# Patient Record
Sex: Female | Born: 1955 | Race: White | Hispanic: No | Marital: Married | Smoking: Never smoker
Health system: Southern US, Community
[De-identification: ages and names within clinical notes are randomized; demographics above are authoritative.]

## PROBLEM LIST (undated history)

## (undated) DIAGNOSIS — G43909 Migraine, unspecified, not intractable, without status migrainosus: Secondary | ICD-10-CM

## (undated) DIAGNOSIS — H8109 Meniere's disease, unspecified ear: Secondary | ICD-10-CM

## (undated) DIAGNOSIS — J45909 Unspecified asthma, uncomplicated: Secondary | ICD-10-CM

## (undated) DIAGNOSIS — K219 Gastro-esophageal reflux disease without esophagitis: Secondary | ICD-10-CM

## (undated) DIAGNOSIS — I10 Essential (primary) hypertension: Secondary | ICD-10-CM

## (undated) HISTORY — DX: Meniere's disease, unspecified ear: H81.09

## (undated) HISTORY — DX: Migraine, unspecified, not intractable, without status migrainosus: G43.909

## (undated) HISTORY — PX: COLONOSCOPY: SHX174

## (undated) HISTORY — PX: ENDOLYMPHATIC SAC OPERATION: SUR433

## (undated) HISTORY — DX: Unspecified asthma, uncomplicated: J45.909

## (undated) HISTORY — PX: BREAST LUMPECTOMY: SHX2

## (undated) HISTORY — DX: Gastro-esophageal reflux disease without esophagitis: K21.9

## (undated) HISTORY — PX: TEMPOROMANDIBULAR JOINT SURGERY: SHX35

## (undated) HISTORY — DX: Essential (primary) hypertension: I10

---

## 2000-05-05 ENCOUNTER — Ambulatory Visit (HOSPITAL_COMMUNITY): Admission: RE | Admit: 2000-05-05 | Discharge: 2000-05-05 | Payer: Self-pay | Admitting: Gynecology

## 2001-07-13 ENCOUNTER — Emergency Department (HOSPITAL_COMMUNITY): Admission: EM | Admit: 2001-07-13 | Discharge: 2001-07-13 | Payer: Self-pay | Admitting: Emergency Medicine

## 2001-07-14 ENCOUNTER — Encounter: Payer: Self-pay | Admitting: Emergency Medicine

## 2001-07-15 ENCOUNTER — Emergency Department (HOSPITAL_COMMUNITY): Admission: EM | Admit: 2001-07-15 | Discharge: 2001-07-15 | Payer: Self-pay | Admitting: Emergency Medicine

## 2002-06-25 ENCOUNTER — Other Ambulatory Visit: Admission: RE | Admit: 2002-06-25 | Discharge: 2002-06-25 | Payer: Self-pay | Admitting: Obstetrics and Gynecology

## 2003-09-09 ENCOUNTER — Other Ambulatory Visit: Admission: RE | Admit: 2003-09-09 | Discharge: 2003-09-09 | Payer: Self-pay | Admitting: Gynecology

## 2003-09-09 ENCOUNTER — Encounter: Admission: RE | Admit: 2003-09-09 | Discharge: 2003-09-09 | Payer: Self-pay | Admitting: Otolaryngology

## 2003-09-09 ENCOUNTER — Encounter: Payer: Self-pay | Admitting: Otolaryngology

## 2004-04-27 ENCOUNTER — Encounter: Admission: RE | Admit: 2004-04-27 | Discharge: 2004-04-27 | Payer: Self-pay | Admitting: Obstetrics & Gynecology

## 2004-09-16 ENCOUNTER — Other Ambulatory Visit: Admission: RE | Admit: 2004-09-16 | Discharge: 2004-09-16 | Payer: Self-pay | Admitting: Gynecology

## 2004-10-08 ENCOUNTER — Encounter: Admission: RE | Admit: 2004-10-08 | Discharge: 2004-10-08 | Payer: Self-pay | Admitting: Family Medicine

## 2004-10-20 ENCOUNTER — Ambulatory Visit (HOSPITAL_COMMUNITY): Admission: RE | Admit: 2004-10-20 | Discharge: 2004-10-20 | Payer: Self-pay | Admitting: Family Medicine

## 2005-09-19 ENCOUNTER — Other Ambulatory Visit: Admission: RE | Admit: 2005-09-19 | Discharge: 2005-09-19 | Payer: Self-pay | Admitting: Gynecology

## 2005-11-19 ENCOUNTER — Emergency Department: Payer: Self-pay | Admitting: Unknown Physician Specialty

## 2005-11-20 ENCOUNTER — Emergency Department: Payer: Self-pay | Admitting: Emergency Medicine

## 2007-04-27 ENCOUNTER — Ambulatory Visit: Payer: Self-pay | Admitting: Internal Medicine

## 2007-12-30 ENCOUNTER — Emergency Department: Payer: Self-pay | Admitting: Emergency Medicine

## 2008-04-09 ENCOUNTER — Telehealth: Payer: Self-pay | Admitting: Internal Medicine

## 2008-04-30 ENCOUNTER — Telehealth: Payer: Self-pay | Admitting: Internal Medicine

## 2008-05-02 ENCOUNTER — Ambulatory Visit: Payer: Self-pay | Admitting: Internal Medicine

## 2008-05-05 LAB — CONVERTED CEMR LAB: Hepatitis B Surface Ag: NEGATIVE

## 2008-05-27 DIAGNOSIS — R109 Unspecified abdominal pain: Secondary | ICD-10-CM | POA: Insufficient documentation

## 2008-05-27 DIAGNOSIS — Z862 Personal history of diseases of the blood and blood-forming organs and certain disorders involving the immune mechanism: Secondary | ICD-10-CM

## 2008-05-27 DIAGNOSIS — H8109 Meniere's disease, unspecified ear: Secondary | ICD-10-CM | POA: Insufficient documentation

## 2008-05-27 DIAGNOSIS — E785 Hyperlipidemia, unspecified: Secondary | ICD-10-CM | POA: Insufficient documentation

## 2008-05-27 DIAGNOSIS — K219 Gastro-esophageal reflux disease without esophagitis: Secondary | ICD-10-CM

## 2008-05-27 DIAGNOSIS — E559 Vitamin D deficiency, unspecified: Secondary | ICD-10-CM | POA: Insufficient documentation

## 2008-05-27 DIAGNOSIS — J45909 Unspecified asthma, uncomplicated: Secondary | ICD-10-CM | POA: Insufficient documentation

## 2008-05-27 DIAGNOSIS — Z8619 Personal history of other infectious and parasitic diseases: Secondary | ICD-10-CM

## 2008-05-27 DIAGNOSIS — Z8639 Personal history of other endocrine, nutritional and metabolic disease: Secondary | ICD-10-CM | POA: Insufficient documentation

## 2008-05-28 ENCOUNTER — Ambulatory Visit: Payer: Self-pay | Admitting: Internal Medicine

## 2008-06-04 ENCOUNTER — Ambulatory Visit (HOSPITAL_COMMUNITY): Admission: RE | Admit: 2008-06-04 | Discharge: 2008-06-04 | Payer: Self-pay | Admitting: Internal Medicine

## 2008-06-04 ENCOUNTER — Encounter: Payer: Self-pay | Admitting: Internal Medicine

## 2008-06-05 ENCOUNTER — Ambulatory Visit: Payer: Self-pay | Admitting: Internal Medicine

## 2008-06-05 ENCOUNTER — Telehealth: Payer: Self-pay | Admitting: Internal Medicine

## 2010-03-19 ENCOUNTER — Emergency Department: Payer: Self-pay | Admitting: Internal Medicine

## 2010-03-19 ENCOUNTER — Emergency Department: Payer: Self-pay | Admitting: Unknown Physician Specialty

## 2011-04-12 NOTE — Assessment & Plan Note (Signed)
[facility]                         GASTROENTEROLOGY OFFICE NOTE   NAME:Emma Snyder, Emma Snyder                        MRN:          [id number]  DATE:04/27/2007                            DOB:          04/05/1956    Emma Snyder is a very nice 55 year old white female who is here to discuss  change in her bowel habits, mostly diarrhea, and also colorectal  screening.  For several years now she has had postprandial urgency and  sometimes diarrhea.  This occurs within 30 minutes to 2 hours  postprandially.  It never happens at night.  There has been 1 accident  when she could not make it to the bathroom.  She denies constipation.  Her usual bowel habits are 1 bowel movement once or twice a day.  Her  weight has increased about 40 pounds in the last 9 to 10 years.  She has  a sedentary job in a bank.  She has good eating habits, trying to  incorporate a little fiber in her diet.  She also has some symptoms of  gastroesophageal reflux for which she took some acid-suppressing agents.   MEDICATIONS:  1. Advair 250 mcg, 1 puff daily.  2. Triamterene 1 p.o. daily.  3. Nexium 40 mg daily.   PAST MEDICAL HISTORY:  Asthmatic bronchitis.   PAST SURGICAL HISTORY:  1. TMJ surgery 25 years ago.  2. Endometriosis surgery 9 years ago.  3. Sinus surgery 4 years ago.   FAMILY HISTORY:  Positive for diverticulosis in her father, breast  cancer in aunt, diabetes in father.   SOCIAL HISTORY:  Married, 1 step-son.  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  She does not smoke.  She drinks about 10 glasses of  wine a week.   REVIEW OF SYSTEMS:  Positive for night sweats, fatigue, and diarrhea.   PHYSICAL EXAMINATION:  VITAL SIGNS:  Blood pressure 110/60, pulse 84,  and weight 169 pounds.  GENERAL:  She was alert and oriented and in no distress.  NECK:  Somewhat thick.  I could not be sure if whether she had a goiter  or just a thick neck.  LUNGS:  Clear to  auscultation.  CARDIAC:  Normal S1, normal S2.  ABDOMEN:  Soft, with minimal tenderness in the left lower quadrant.  Normal left upper quadrant and right upper and lower quadrants.  Liver  edge was at costal margin.  There were no scars.  There was no  distension.  Bowel sounds were normoactive.  There was no CVA  tenderness.  RECTAL:  Exam reveals normal perianal area, normal rectal tone with  small amount of Hemoccult-negative stool.   IMPRESSION:  55 year old white female with symptoms suggestive of  irritable bowel syndrome, namely postprandial urgency and diarrhea,  without any obvious occult gastrointestinal blood loss or failure to  thrive.  There is no family history of inflammatory bowel disease, and  she has actually continued to gain weight.  Besides irritable bowel  syndrome, one would consider the possibility of lactose intolerance or  possible sprue.  Also rule out symptomatic  diverticulosis since she has  a family history of it.  Also her thyroid may be enlarged, so we are  going to check a thyroid-stimulating hormone to make sure she does not  have hyperactive thyroid.   PLAN:  1. Tissue transglutaminase levels and TSH.  This might have been      checked by Dr. Smith Mince, but I do not have the results.  2. I discussed treatment of irritable bowel syndrome over with the      patient, and we will start her on Levbid 0.375 mg p.o. daily or      b.i.d.  3. High fiber diet.  4. Colonoscopy scheduled for neoplastic screening.  5. Information booklet on IBS given to the patient.  6. During the colonoscopy we will plan to get random biopsies to rule      out microscopic colitis.     Hedwig Morton. Juanda Chance, MD  Electronically Signed    DMB/MedQ  DD: 04/27/2007  DT: 04/27/2007  Job #: [id number]   cc:   Talmadge Coventry, M.D.  Luvenia Redden, M.D.

## 2011-04-15 NOTE — Op Note (Signed)
[facility]  Patient:    Emma Snyder, Emma Snyder                        MRN: [id number] Proc. Date: 05/05/00 Adm. Date:  [id number] Disc. Date: [id number] Attending:  Susa Raring                           Operative Report  PREOPERATIVE DIAGNOSES:       1. Chronic pelvic pain.                               2. Dysmenorrhea.  POSTOPERATIVE DIAGNOSES:      1. Pelvic adhesions.                               2. No endometriosis found.  OPERATION:                    Laparoscopy and lysis of adhesions.  SURGEON:                      Luvenia Redden, M.D.  PROCEDURE:                    With good anesthesia the patient was prepped and draped in a sterile manner.  Hulka tenaculum placed on the uterus.  The bladder was catheterization.  Transverse incision was made just below the umbilicus.  Veress needle inserted into the peritoneum and pneumoperitoneum formed.  The needle was removed.  The operative Trocar was introduced.  Trocar was removed from the cannula and the operative laparoscope inserted.  Pelvic organs reviewed.  There were adhesions from the omentum to the fundus of the uterus.  Operative 5 mm Trocar was introduced through an avascular site in the midline.  The adhesions were dissected free of the uterus and the cornua by sharp dissection and freed up.  There were adhesions also down in the right lower quadrant.  These were lysed sharply.  Pelvic peritoneum was inspected. There was no evidence of any endometriosis in the cul-de-sac, on the bladder peritoneum, uterus, tubes, or ovaries.  The area was irrigated with lactated Ringers and this was aspirated.  Upper abdominal organs appeared normal on the surface as did the liver and the gallbladder.  Lactated Ringers 200 cc was instilled into the pelvis to float the pelvic organs.  Pneumoperitoneum was released and the instruments were removed.  Skin incisions were closed with subcuticular 2-0 plain  catgut.  Band-aid was applied.  The patient was removed to recovery in good condition. DD:  07/11/00 TD:  07/11/00 Job: [id number]

## 2016-01-05 ENCOUNTER — Other Ambulatory Visit: Payer: Self-pay | Admitting: Obstetrics and Gynecology

## 2016-01-05 DIAGNOSIS — E2839 Other primary ovarian failure: Secondary | ICD-10-CM

## 2016-01-05 DIAGNOSIS — R5381 Other malaise: Secondary | ICD-10-CM

## 2016-11-11 ENCOUNTER — Other Ambulatory Visit: Payer: Self-pay | Admitting: Obstetrics and Gynecology

## 2016-11-11 DIAGNOSIS — R5381 Other malaise: Secondary | ICD-10-CM

## 2016-12-23 ENCOUNTER — Other Ambulatory Visit: Payer: Self-pay | Admitting: Neurosurgery

## 2016-12-23 DIAGNOSIS — M5412 Radiculopathy, cervical region: Secondary | ICD-10-CM

## 2016-12-30 ENCOUNTER — Ambulatory Visit
Admission: RE | Admit: 2016-12-30 | Discharge: 2016-12-30 | Disposition: A | Discharge status: Home / Self Care | Payer: BLUE CROSS/BLUE SHIELD | Source: Ambulatory Visit | Attending: Neurosurgery | Admitting: Neurosurgery

## 2016-12-30 DIAGNOSIS — M5412 Radiculopathy, cervical region: Secondary | ICD-10-CM

## 2016-12-30 MED ORDER — GADOBENATE DIMEGLUMINE 529 MG/ML IV SOLN
20.0000 mL | Freq: Once | INTRAVENOUS | Status: DC | PRN
Start: 2016-12-30 — End: 2016-12-31

## 2017-01-03 ENCOUNTER — Other Ambulatory Visit: Payer: Self-pay | Admitting: Neurosurgery

## 2017-01-03 DIAGNOSIS — M5412 Radiculopathy, cervical region: Secondary | ICD-10-CM

## 2017-01-10 ENCOUNTER — Other Ambulatory Visit: Payer: Self-pay | Admitting: Obstetrics and Gynecology

## 2017-01-10 DIAGNOSIS — M810 Age-related osteoporosis without current pathological fracture: Secondary | ICD-10-CM

## 2017-01-12 ENCOUNTER — Other Ambulatory Visit: Payer: Self-pay | Admitting: Obstetrics and Gynecology

## 2017-01-12 DIAGNOSIS — R928 Other abnormal and inconclusive findings on diagnostic imaging of breast: Secondary | ICD-10-CM

## 2017-01-16 ENCOUNTER — Ambulatory Visit
Admission: RE | Admit: 2017-01-16 | Discharge: 2017-01-16 | Disposition: A | Discharge status: Home / Self Care | Payer: BLUE CROSS/BLUE SHIELD | Source: Ambulatory Visit | Attending: Obstetrics and Gynecology | Admitting: Obstetrics and Gynecology

## 2017-01-16 DIAGNOSIS — R928 Other abnormal and inconclusive findings on diagnostic imaging of breast: Secondary | ICD-10-CM

## 2017-02-20 ENCOUNTER — Other Ambulatory Visit: Payer: Self-pay | Admitting: Obstetrics and Gynecology

## 2017-02-20 DIAGNOSIS — E2839 Other primary ovarian failure: Secondary | ICD-10-CM

## 2017-02-21 ENCOUNTER — Ambulatory Visit
Admission: RE | Admit: 2017-02-21 | Discharge: 2017-02-21 | Disposition: A | Discharge status: Home / Self Care | Payer: BLUE CROSS/BLUE SHIELD | Source: Ambulatory Visit | Attending: Obstetrics and Gynecology | Admitting: Obstetrics and Gynecology

## 2017-02-21 ENCOUNTER — Other Ambulatory Visit: Payer: BLUE CROSS/BLUE SHIELD

## 2017-02-21 DIAGNOSIS — E2839 Other primary ovarian failure: Secondary | ICD-10-CM

## 2018-04-09 ENCOUNTER — Encounter: Payer: Self-pay | Admitting: Internal Medicine

## 2019-05-22 ENCOUNTER — Other Ambulatory Visit: Payer: Self-pay | Admitting: Obstetrics and Gynecology

## 2019-05-22 DIAGNOSIS — R928 Other abnormal and inconclusive findings on diagnostic imaging of breast: Secondary | ICD-10-CM

## 2019-05-30 ENCOUNTER — Other Ambulatory Visit: Payer: BLUE CROSS/BLUE SHIELD

## 2019-06-14 ENCOUNTER — Ambulatory Visit
Admission: RE | Admit: 2019-06-14 | Discharge: 2019-06-14 | Disposition: A | Discharge status: Home / Self Care | Payer: BC Managed Care – PPO | Source: Ambulatory Visit | Attending: Obstetrics and Gynecology | Admitting: Obstetrics and Gynecology

## 2019-06-14 ENCOUNTER — Ambulatory Visit
Admission: RE | Admit: 2019-06-14 | Discharge: 2019-06-14 | Disposition: A | Discharge status: Home / Self Care | Payer: BLUE CROSS/BLUE SHIELD | Source: Ambulatory Visit | Attending: Obstetrics and Gynecology | Admitting: Obstetrics and Gynecology

## 2019-06-14 ENCOUNTER — Other Ambulatory Visit: Payer: Self-pay | Admitting: Obstetrics and Gynecology

## 2019-06-14 DIAGNOSIS — R928 Other abnormal and inconclusive findings on diagnostic imaging of breast: Secondary | ICD-10-CM

## 2019-06-14 DIAGNOSIS — N6489 Other specified disorders of breast: Secondary | ICD-10-CM

## 2019-06-21 ENCOUNTER — Ambulatory Visit
Admission: RE | Admit: 2019-06-21 | Discharge: 2019-06-21 | Disposition: A | Discharge status: Home / Self Care | Payer: BC Managed Care – PPO | Source: Ambulatory Visit | Attending: Obstetrics and Gynecology | Admitting: Obstetrics and Gynecology

## 2019-06-21 ENCOUNTER — Other Ambulatory Visit: Payer: Self-pay

## 2019-06-21 DIAGNOSIS — N6489 Other specified disorders of breast: Secondary | ICD-10-CM

## 2019-08-24 IMAGING — MG DIGITAL DIAGNOSTIC UNILATERAL RIGHT MAMMOGRAM WITH TOMO AND CAD
8 series · 8 of 24 positions shown · non-contrast
Comparison: Previous exam(s).

CLINICAL DATA: Screening recall for a possible distortion in the
right breast.

EXAM:
DIGITAL DIAGNOSTIC RIGHT MAMMOGRAM WITH CAD AND TOMO
ULTRASOUND RIGHT BREAST

[R CC synth-2D (1 of 2)]
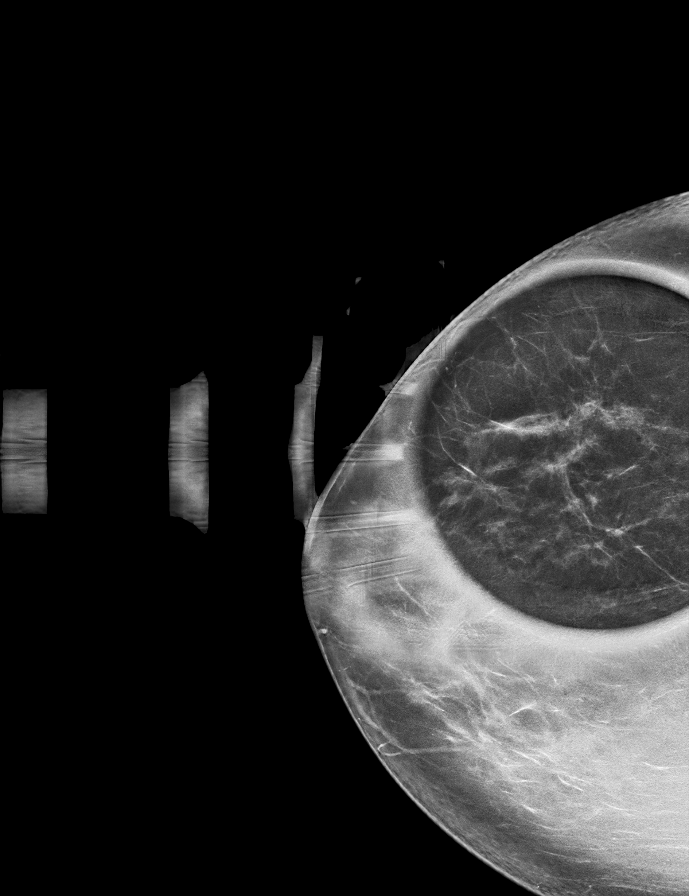

[R MLO synth-2D (1 of 2)]
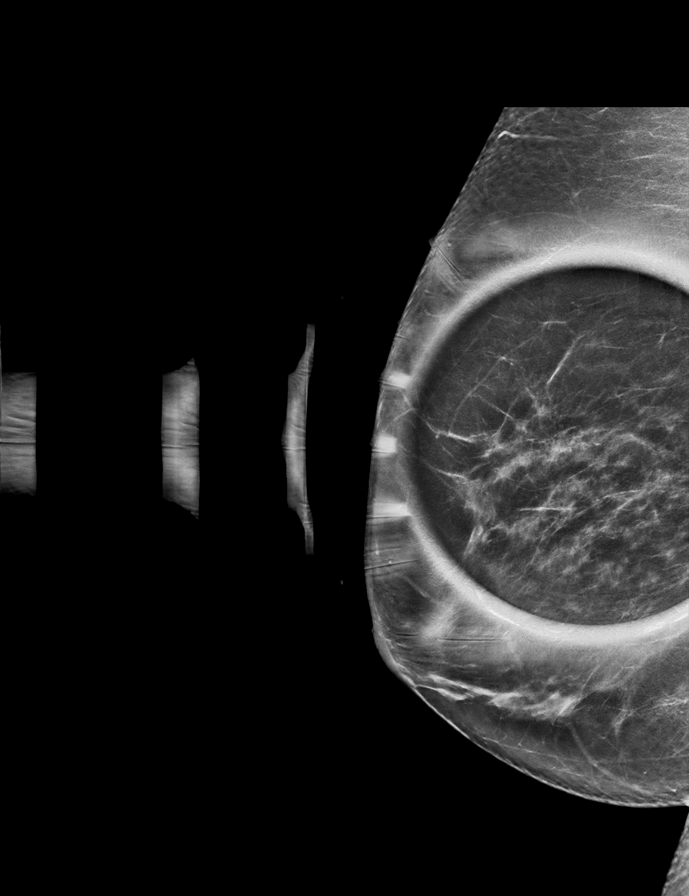

[R CC synth-2D (2 of 2)]
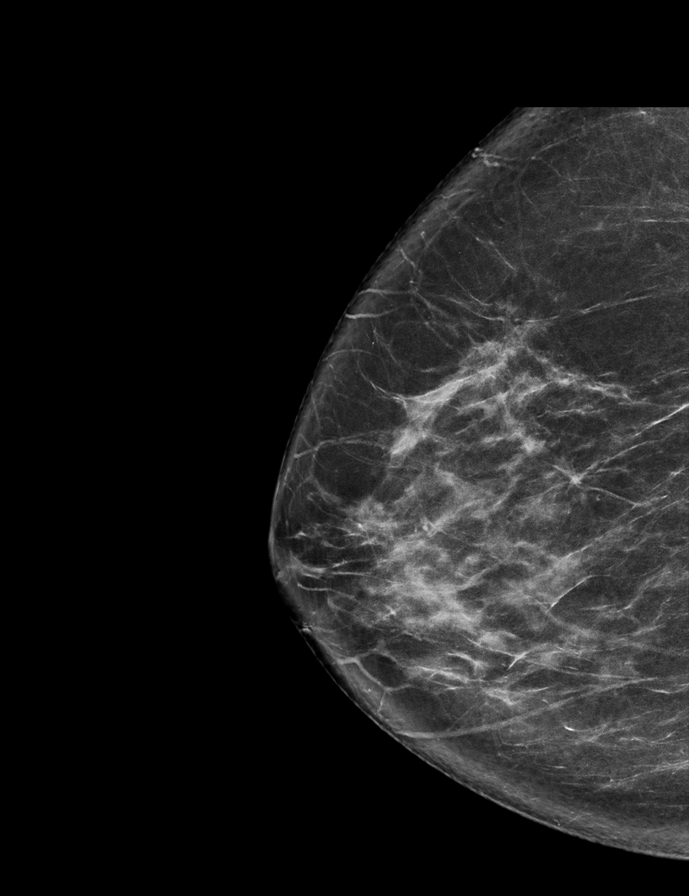

[R MLO synth-2D (2 of 2)]
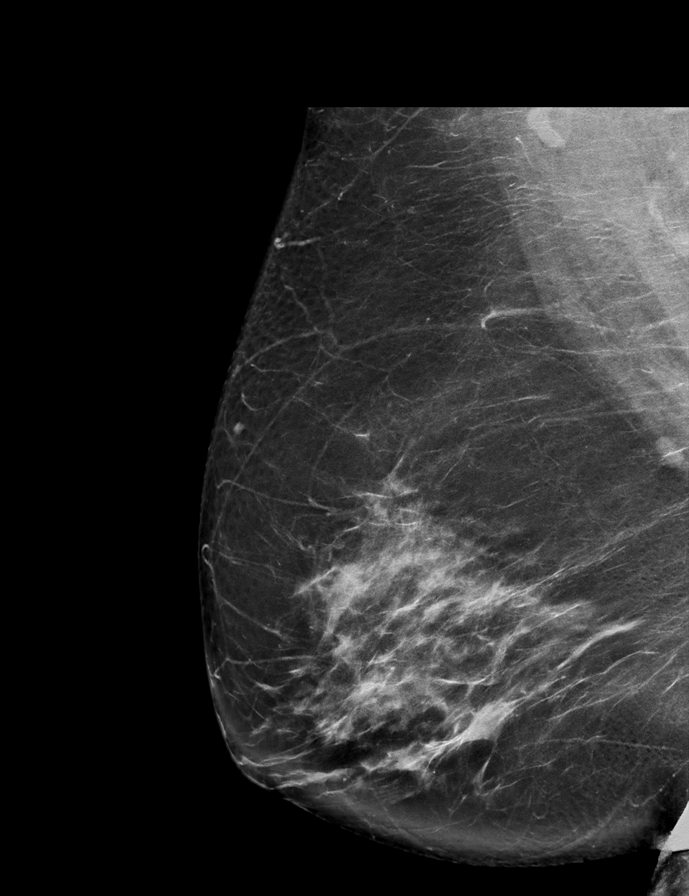

[R MLO tomo (1 of 2) · tomo slice 35/70.0]
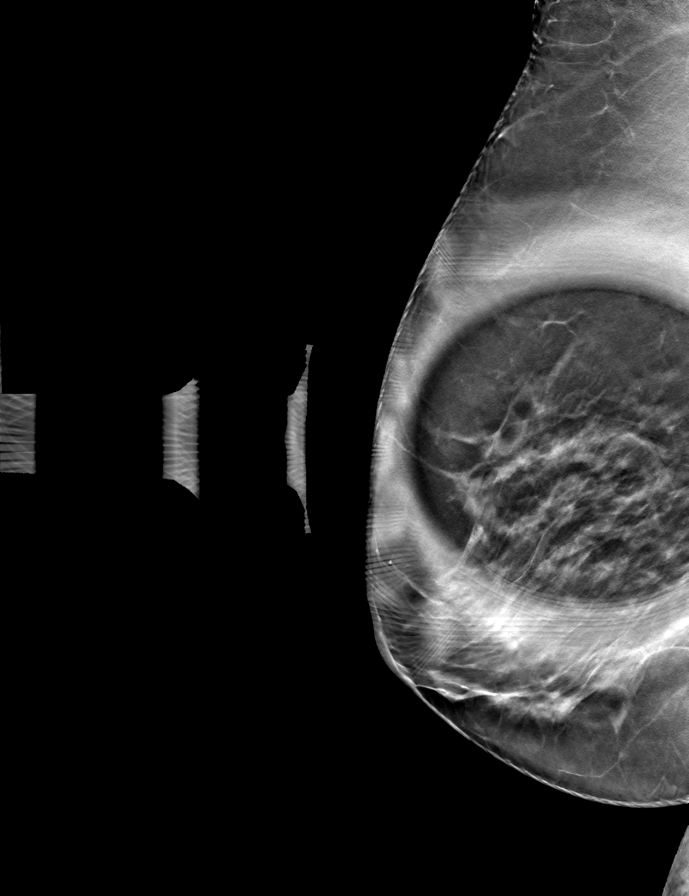

[R MLO tomo (2 of 2) · tomo slice 43/86.0]
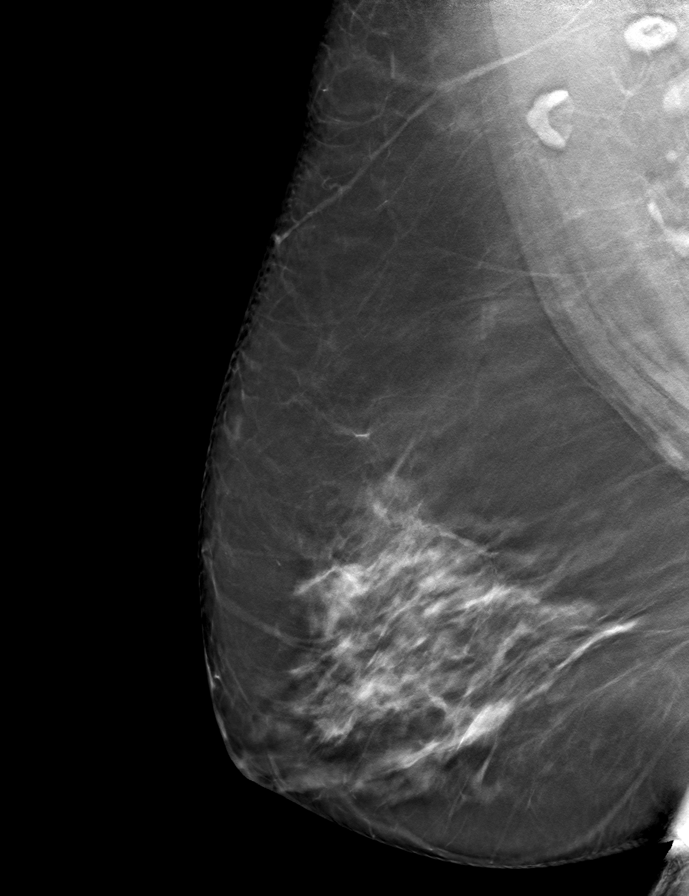

[R CC tomo (1 of 2) · tomo slice 33/65.0]
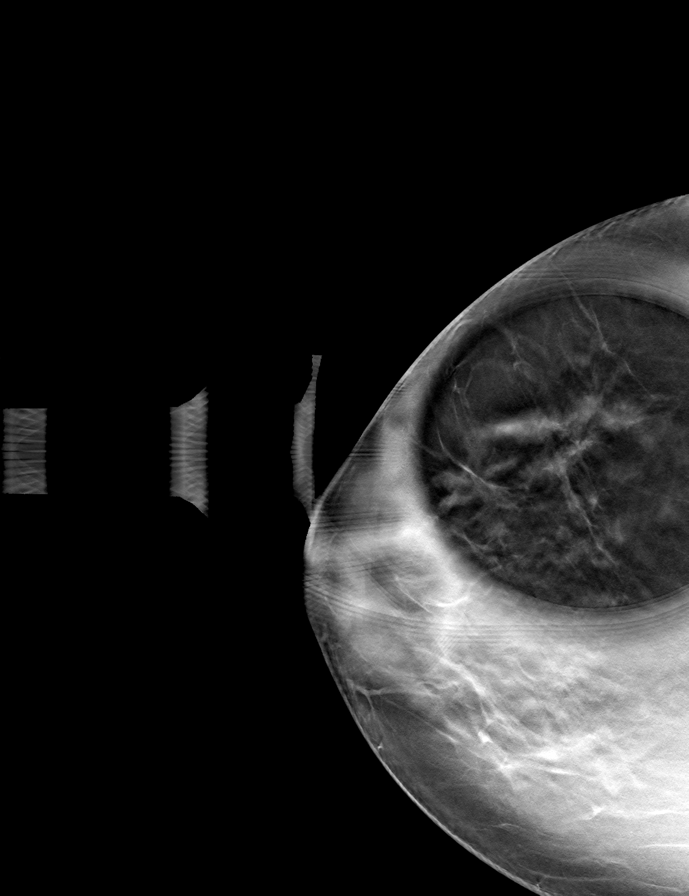

[R CC tomo (2 of 2) · tomo slice 37/73.0]
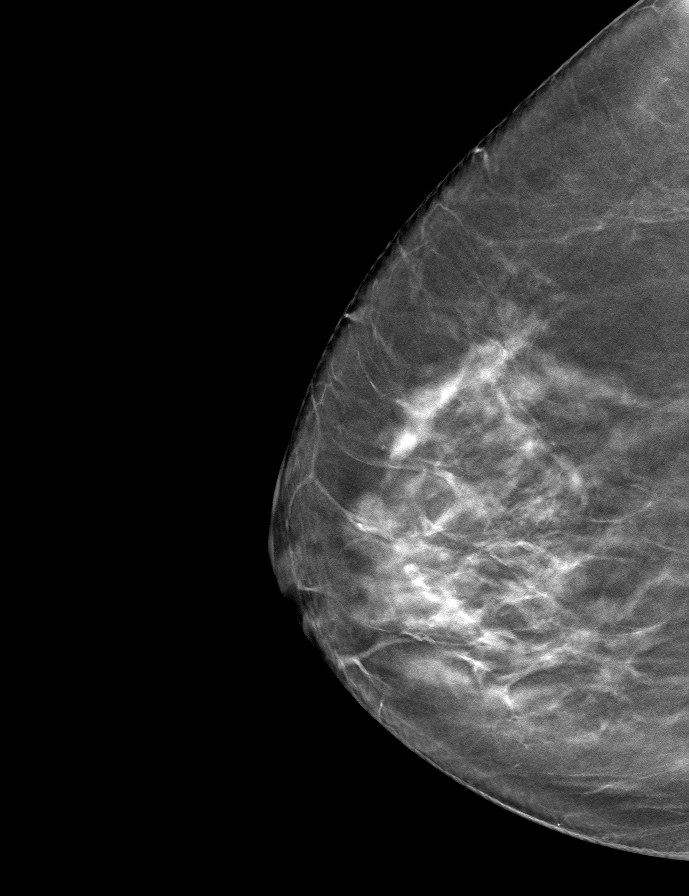

[8 of 24 positions shown; findings below may reference images not displayed]

ACR Breast Density Category c: The breast tissue is heterogeneously
dense, which may obscure small masses.
FINDINGS: Spot compression tomosynthesis images through the upper outer
quadrant of the right breast demonstrates a possible subtle area of
distortion in the posterior depth.

Mammographic images were processed with CAD.

Ultrasound of the upper-outer quadrant of the right breast
demonstrates no suspicious masses or areas of shadowing.
IMPRESSION: 1. There is a possible subtle area of distortion in the upper-outer
quadrant of the right breast without a sonographic correlate.

RECOMMENDATION:
Stereotactic biopsy is recommended for the possible subtle right
breast distortion. The patient is aware that due to the subtle
appearance, the biopsy may be canceled if the distortion cannot be
found and a six-month follow-up right breast mammogram would be
recommended.

The stereotactic biopsy has been scheduled for 06/21/2019 at [DATE]
a.m.

I have discussed the findings and recommendations with the patient.
Results were also provided in writing at the conclusion of the
visit. If applicable, a reminder letter will be sent to the patient
regarding the next appointment.

BI-RADS CATEGORY  4: Suspicious.

## 2022-04-20 ENCOUNTER — Encounter: Payer: Self-pay | Admitting: Internal Medicine

## 2022-05-24 ENCOUNTER — Ambulatory Visit (AMBULATORY_SURGERY_CENTER): Payer: Self-pay | Admitting: *Deleted

## 2022-05-24 VITALS — Ht 62.0 in | Wt 203.0 lb

## 2022-05-24 DIAGNOSIS — Z1211 Encounter for screening for malignant neoplasm of colon: Secondary | ICD-10-CM

## 2022-05-24 MED ORDER — ONDANSETRON HCL 4 MG PO TABS: 4.0000 mg | ORAL_TABLET | Freq: Three times a day (TID) | ORAL | 0 refills | Status: AC | PRN

## 2022-05-24 MED ORDER — NA SULFATE-K SULFATE-MG SULF 17.5-3.13-1.6 GM/177ML PO SOLN: 1.0000 | Freq: Once | ORAL | 0 refills | Status: AC

## 2022-05-24 NOTE — Progress Notes (Signed)
  No trouble with anesthesia, denies being told they were difficult to intubate, or hx/fam hx of malignant hyperthermia per pt   No egg or soy allergy  No home oxygen use   No medications for weight loss taken  emmi information given  Pt denies constipation issues  Pt informed that we do not do prior authorizations for prep  Zofran 4 mg po #2, 0 refills sent to pharmacy and pt instructed how to take prior to drinking prep

## 2022-06-16 ENCOUNTER — Encounter: Payer: Self-pay | Admitting: Internal Medicine

## 2022-06-21 ENCOUNTER — Ambulatory Visit (AMBULATORY_SURGERY_CENTER): Payer: BC Managed Care – PPO | Admitting: Internal Medicine

## 2022-06-21 ENCOUNTER — Encounter: Payer: Self-pay | Admitting: Internal Medicine

## 2022-06-21 VITALS — BP 132/68 | HR 92 | Temp 97.8°F | Resp 29 | Ht 62.0 in | Wt 203.0 lb

## 2022-06-21 DIAGNOSIS — D122 Benign neoplasm of ascending colon: Secondary | ICD-10-CM | POA: Diagnosis not present

## 2022-06-21 DIAGNOSIS — D120 Benign neoplasm of cecum: Secondary | ICD-10-CM

## 2022-06-21 DIAGNOSIS — Z1211 Encounter for screening for malignant neoplasm of colon: Secondary | ICD-10-CM

## 2022-06-21 DIAGNOSIS — D123 Benign neoplasm of transverse colon: Secondary | ICD-10-CM

## 2022-06-21 MED ORDER — SODIUM CHLORIDE 0.9 % IV SOLN: 500.0000 mL | Freq: Once | INTRAVENOUS | Status: DC

## 2022-06-21 NOTE — Progress Notes (Signed)
Pt's states no medical or surgical changes since previsit or office visit. 

## 2022-06-21 NOTE — Op Note (Signed)
[facility] Patient Name: Donye Dauenhauer Procedure Date: 06/21/2022 9:07 AM MRN: [id number] Endoscopist: Sonny Masters "Christia Reading ,  Age: 66 Referring MD:  Date of Birth: 09-23-1956 Gender: Female Account #: [id number] Procedure:                Colonoscopy Indications:              Screening for colorectal malignant neoplasm Medicines:                Monitored Anesthesia Care Procedure:                Pre-Anesthesia Assessment:                           - Prior to the procedure, a History and Physical                            was performed, and patient medications and                            allergies were reviewed. The patient's tolerance of                            previous anesthesia was also reviewed. The risks                            and benefits of the procedure and the sedation                            options and risks were discussed with the patient.                            All questions were answered, and informed consent                            was obtained. Prior Anticoagulants: The patient has                            taken no previous anticoagulant or antiplatelet                            agents. ASA Grade Assessment: II - A patient with                            mild systemic disease. After reviewing the risks                            and benefits, the patient was deemed in                            satisfactory condition to undergo the procedure.                           After obtaining informed consent, the colonoscope  was passed under direct vision. Throughout the                            procedure, the patient's blood pressure, pulse, and                            oxygen saturations were monitored continuously. The                            Olympus CF-HQ190L [id number]) Colonoscope was                            introduced through the anus and advanced to the the                            terminal ileum.  The colonoscopy was performed                            without difficulty. The patient tolerated the                            procedure well. The quality of the bowel                            preparation was good. The terminal ileum, ileocecal                            valve, appendiceal orifice, and rectum were                            photographed. Scope In: 9:09:02 AM Scope Out: 9:38:35 AM Scope Withdrawal Time: 0 hours 20 minutes 21 seconds  Total Procedure Duration: 0 hours 29 minutes 33 seconds  Findings:                 The terminal ileum appeared normal.                           A few diverticula were found in the ascending colon.                           A 2 mm polyp was found in the cecum. The polyp was                            sessile. The polyp was removed with a cold biopsy                            forceps. Resection and retrieval were complete.                           Three sessile polyps were found in the transverse                            colon and ascending colon. The polyps were 3 to  5                            mm in size. These polyps were removed with a cold                            snare. Resection and retrieval were complete.                           Two sessile polyps were found in the sigmoid colon                            and descending colon. The polyps were 1 to 2 mm in                            size. These polyps were removed with a cold biopsy                            forceps. Resection and retrieval were complete.                           Non-bleeding internal hemorrhoids were found during                            retroflexion. Complications:            No immediate complications. Estimated Blood Loss:     Estimated blood loss was minimal. Impression:               - The examined portion of the ileum was normal.                           - Diverticulosis in the ascending colon.                           - One 2 mm polyp in the  cecum, removed with a cold                            biopsy forceps. Resected and retrieved.                           - Three 3 to 5 mm polyps in the transverse colon                            and in the ascending colon, removed with a cold                            snare. Resected and retrieved.                           - Two 1 to 2 mm polyps in the sigmoid colon and in                            the descending colon,  removed with a cold biopsy                            forceps. Resected and retrieved.                           - Non-bleeding internal hemorrhoids. Recommendation:           - Discharge patient to home (with escort).                           - Await pathology results.                           - The findings and recommendations were discussed                            with the patient. Sonny Masters "Christia Reading,  06/21/2022 9:44:34 AM

## 2022-06-21 NOTE — Progress Notes (Signed)
To pacu, VSS. Report to [email or web address].tb 

## 2022-06-21 NOTE — Patient Instructions (Signed)
Thank you for coming in  to see [place] today! Resume your diet and medications today. Return to your normal daily activities tomorrow. Biopsy results will be final in 7-10 days and recommendation for future colonoscopy will be made at that time.    YOU HAD AN ENDOSCOPIC PROCEDURE TODAY AT [facility]:   Refer to the procedure report that was given to you for any specific questions about what was found during the examination.  If the procedure report does not answer your questions, please call your gastroenterologist to clarify.  If you requested that your care partner not be given the details of your procedure findings, then the procedure report has been included in a sealed envelope for you to review at your convenience later.  YOU SHOULD EXPECT: Some feelings of bloating in the abdomen. Passage of more gas than usual.  Walking can help get rid of the air that was put into your GI tract during the procedure and reduce the bloating. If you had a lower endoscopy (such as a colonoscopy or flexible sigmoidoscopy) you may notice spotting of blood in your stool or on the toilet paper. If you underwent a bowel prep for your procedure, you may not have a normal bowel movement for a few days.  Please Note:  You might notice some irritation and congestion in your nose or some drainage.  This is from the oxygen used during your procedure.  There is no need for concern and it should clear up in a day or so.  SYMPTOMS TO REPORT IMMEDIATELY:  Following lower endoscopy (colonoscopy or flexible sigmoidoscopy):  Excessive amounts of blood in the stool  Significant tenderness or worsening of abdominal pains  Swelling of the abdomen that is new, acute  Fever of 100F or higher  For urgent or emergent issues, a gastroenterologist can be reached at any hour by calling [phone]. Do not use MyChart messaging for urgent concerns.    DIET:  We do recommend a small meal at first, but then you may  proceed to your regular diet.  Drink plenty of fluids but you should avoid alcoholic beverages for 24 hours.  ACTIVITY:  You should plan to take it easy for the rest of today and you should NOT DRIVE or use heavy machinery until tomorrow (because of the sedation medicines used during the test).    FOLLOW UP: Our staff will call the number listed on your records the next business day following your procedure.  We will call around 7:15- 8:00 am to check on you and address any questions or concerns that you may have regarding the information given to you following your procedure. If we do not reach you, we will leave a message.  If you develop any symptoms (ie: fever, flu-like symptoms, shortness of breath, cough etc.) before then, please call [phone].  If you test positive for Covid 19 in the 2 weeks post procedure, please call and report this information to [place].    If any biopsies were taken you will be contacted by phone or by letter within the next 1-3 weeks.  Please call us at [phone] if you have not heard about the biopsies in 3 weeks.    SIGNATURES/CONFIDENTIALITY: You and/or your care partner have signed paperwork which will be entered into your electronic medical record.  These signatures attest to the fact that that the information above on your After Visit Summary has been reviewed and is understood.  Full responsibility of the  confidentiality of this discharge information lies with you and/or your care-partner.  

## 2022-06-21 NOTE — Progress Notes (Signed)
GASTROENTEROLOGY PROCEDURE H&P NOTE   Primary Care Physician: [name], MD    Reason for Procedure:   Colon cancer screening  Plan:    Colonoscopy  Patient is appropriate for endoscopic procedure(s) in the ambulatory ([place]) setting.  The nature of the procedure, as well as the risks, benefits, and alternatives were carefully and thoroughly reviewed with the patient. Ample time for discussion and questions allowed. The patient understood, was satisfied, and agreed to proceed.     HPI: Emma Snyder is a 66 y.o. female who presents for colonoscopy for colon cancer screening. Denies blood in stools, changes in bowel habits, weight loss. Denies family history of colon cancer.  Past Medical History:  Diagnosis Date   Asthma    GERD (gastroesophageal reflux disease)    Hypertension    takes Metoprolol for this and possible palpitations in the past   Meniere's disease    Migraines     Past Surgical History:  Procedure Laterality Date   BREAST LUMPECTOMY Right    x2   COLONOSCOPY     ENDOLYMPHATIC [facility] OPERATION     TEMPOROMANDIBULAR JOINT SURGERY      Prior to Admission medications   Medication Sig Start Date End Date Taking? Authorizing Provider  albuterol (VENTOLIN HFA) 108 (90 Base) MCG/ACT inhaler Inhale into the lungs as needed. 12/31/18  Yes [provider]  BREO ELLIPTA 200-25 MCG/ACT AEPB 1 puff daily. 03/30/22  Yes [provider]  Cholecalciferol 125 MCG (5000 UT) TABS Take by mouth daily.   Yes [provider]  nortriptyline (PAMELOR) 50 MG capsule Take 50 mg by mouth at bedtime. 02/28/22  Yes [provider]  ondansetron (ZOFRAN) 4 MG tablet Take 1 tablet (4 mg total) by mouth every 8 (eight) hours as needed for nausea or vomiting. Take per colonoscopy instructions 05/24/22  Yes Sharyn Creamer, MD  pantoprazole (PROTONIX) 40 MG tablet daily. 03/06/22  Yes [provider]  albuterol (PROVENTIL) (2.5 MG/3ML) 0.083%  nebulizer solution as needed.    [provider]  Frovatriptan Succinate (FROVA PO) Take by mouth. Takes PRN for migraines    [provider]  metoprolol succinate (TOPROL-XL) 50 MG 24 hr tablet Take 50 mg by mouth daily. 03/06/22   [provider]    Current Outpatient Medications  Medication Sig Dispense Refill   albuterol (VENTOLIN HFA) 108 (90 Base) MCG/ACT inhaler Inhale into the lungs as needed.     BREO ELLIPTA 200-25 MCG/ACT AEPB 1 puff daily.     Cholecalciferol 125 MCG (5000 UT) TABS Take by mouth daily.     nortriptyline (PAMELOR) 50 MG capsule Take 50 mg by mouth at bedtime.     ondansetron (ZOFRAN) 4 MG tablet Take 1 tablet (4 mg total) by mouth every 8 (eight) hours as needed for nausea or vomiting. Take per colonoscopy instructions 2 tablet 0   pantoprazole (PROTONIX) 40 MG tablet daily.     albuterol (PROVENTIL) (2.5 MG/3ML) 0.083% nebulizer solution as needed.     Frovatriptan Succinate (FROVA PO) Take by mouth. Takes PRN for migraines     metoprolol succinate (TOPROL-XL) 50 MG 24 hr tablet Take 50 mg by mouth daily.     Current Facility-Administered Medications  Medication Dose Route Frequency Provider Last Rate Last Admin   0.9 %  sodium chloride infusion  500 mL Intravenous Once Sharyn Creamer, MD        Allergies as of 06/21/2022 - Review Complete 06/21/2022  Allergen Reaction Noted   Aspirin  05/27/2008   Other  05/24/2022    Family History  Problem Relation Age of Onset   Colon cancer Neg Hx    Esophageal cancer Neg Hx    Rectal cancer Neg Hx    Stomach cancer Neg Hx     Social History   Socioeconomic History   Marital status: Married    Spouse name: Not on file   Number of children: Not on file   Years of education: Not on file   Highest education level: Not on file  Occupational History   Not on file  Tobacco Use   Smoking status: Never   Smokeless tobacco: Never  Vaping Use   Vaping Use: Never used  Substance and  Sexual Activity   Alcohol use: Yes    Comment: occ wine   Drug use: Never   Sexual activity: Not on file  Other Topics Concern   Not on file  Social History Narrative   Not on file   Social Determinants of Health   Financial Resource Strain: Not on file  Food Insecurity: Not on file  Transportation Needs: Not on file  Physical Activity: Not on file  Stress: Not on file  Social Connections: Not on file  Intimate Partner Violence: Not on file    Physical Exam: Vital signs in last 24 hours: BP (!) 156/85   Pulse (!) 108   Temp 97.8 F (36.6 C)   Ht '5\' 2"'$  (1.575 m)   Wt 203 lb (92.1 kg)   SpO2 94%   BMI 37.13 kg/m  GEN: NAD EYE: Sclerae anicteric ENT: MMM CV: Non-tachycardic Pulm: No increased work of breathing GI: Soft, NT/ND NEURO:  Alert & Oriented   Christia Reading, MD [facility]  06/21/2022 9:01 AM

## 2022-06-22 ENCOUNTER — Telehealth: Payer: Self-pay

## 2022-06-22 NOTE — Telephone Encounter (Signed)
Left message

## 2022-06-24 ENCOUNTER — Encounter: Payer: Self-pay | Admitting: Internal Medicine
